# Patient Record
Sex: Male | Born: 1985 | Hispanic: No | Marital: Married | Smoking: Former smoker
Health system: Southern US, Community
[De-identification: ages and names within clinical notes are randomized; demographics above are authoritative.]

---
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
## 2005-09-12 ENCOUNTER — Emergency Department: Payer: Self-pay | Admitting: Emergency Medicine

## 2006-09-18 ENCOUNTER — Emergency Department: Payer: Self-pay | Admitting: Unknown Physician Specialty

## 2013-10-27 DIAGNOSIS — Z8679 Personal history of other diseases of the circulatory system: Secondary | ICD-10-CM | POA: Insufficient documentation

## 2013-10-27 DIAGNOSIS — G43909 Migraine, unspecified, not intractable, without status migrainosus: Secondary | ICD-10-CM | POA: Insufficient documentation

## 2014-03-25 ENCOUNTER — Emergency Department: Payer: Self-pay | Admitting: Emergency Medicine

## 2015-01-14 IMAGING — CT CT HEAD WITHOUT CONTRAST
1 series · 16 of 30 positions shown, 20 images · non-contrast
Comparison: None

CLINICAL DATA: Recent nose bleeds and frontal headache. Blurred
vision.

EXAM:
CT HEAD WITHOUT CONTRAST
TECHNIQUE: Contiguous axial images were obtained from the base of the skull
through the vertex without contrast.

[Series 2: head wo · axial · 0.40mm/px · z∈[-113,+22]mm · 16 of 34 slices shown, 20 images]
[im 2/34  brain]
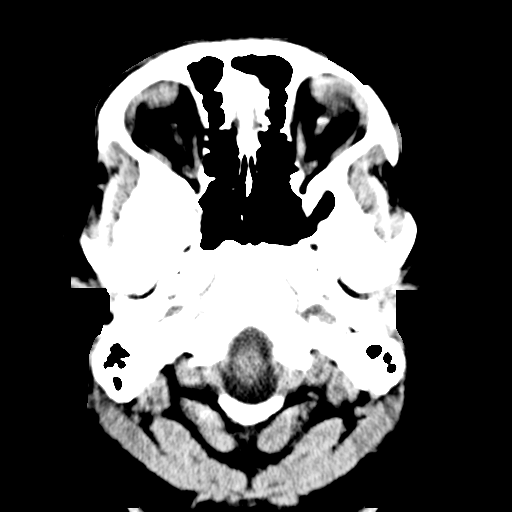
[im 2/34  bone]
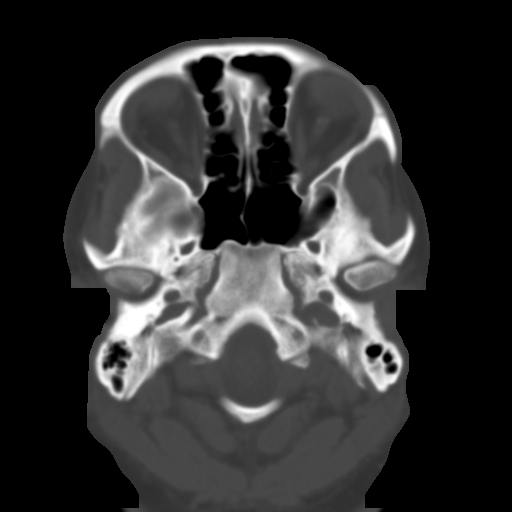
[im 4/34  brain]
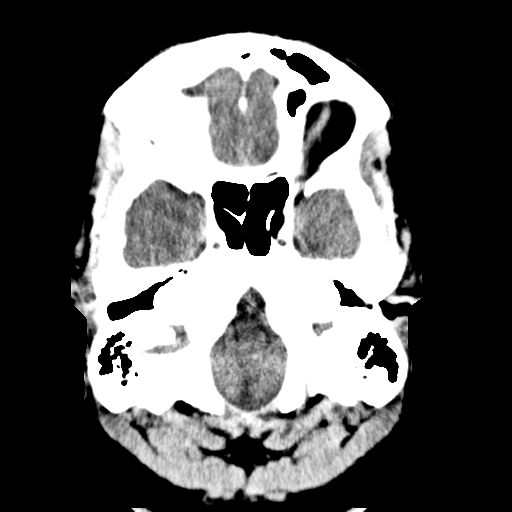
[im 6/34  brain]
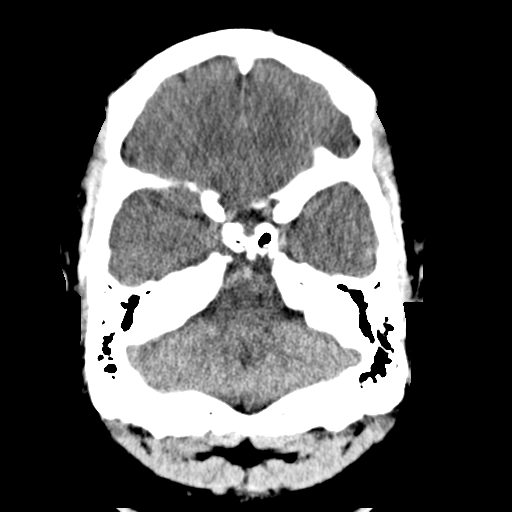
[im 8/34  brain]
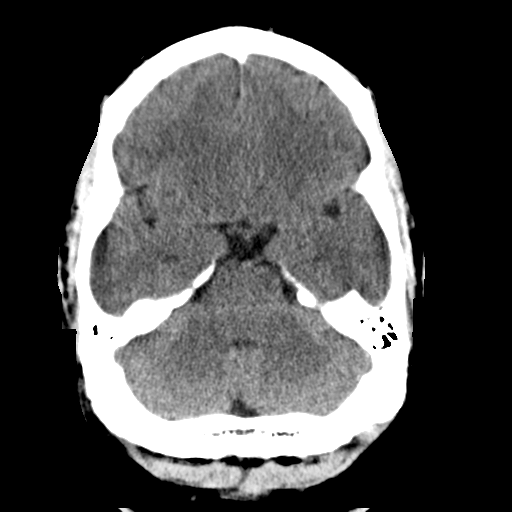
[im 10/34  brain]
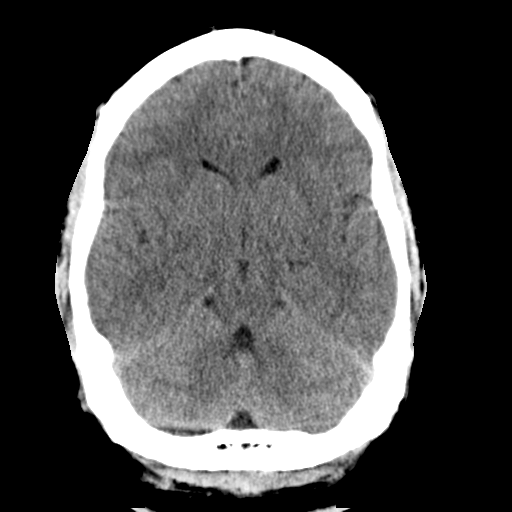
[im 10/34  bone]
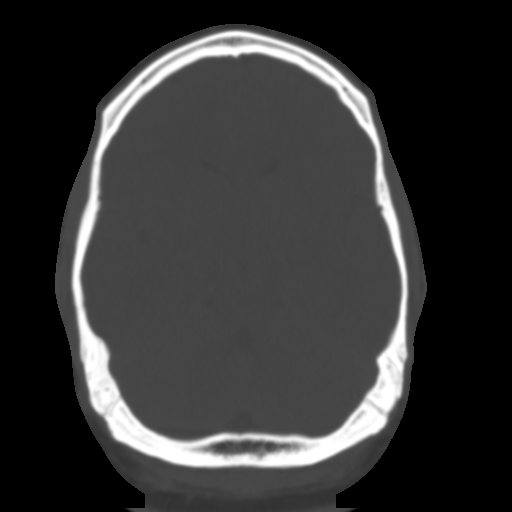
[im 12/34  brain]
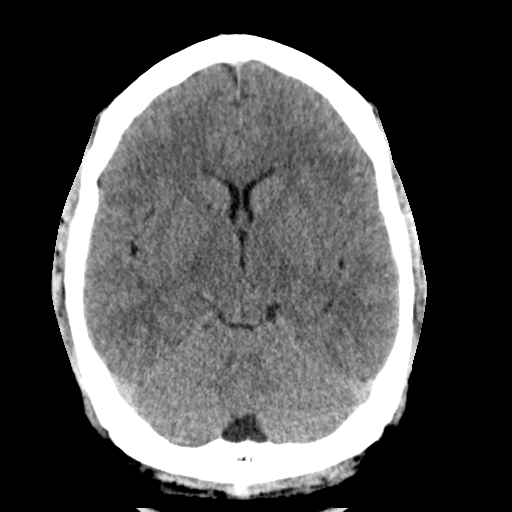
[im 14/34  brain]
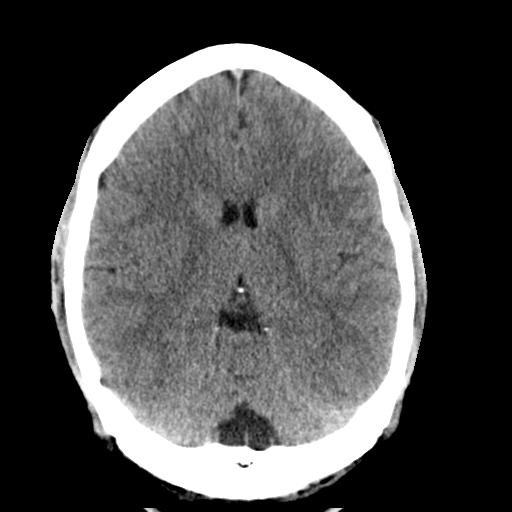
[im 16/34  brain]
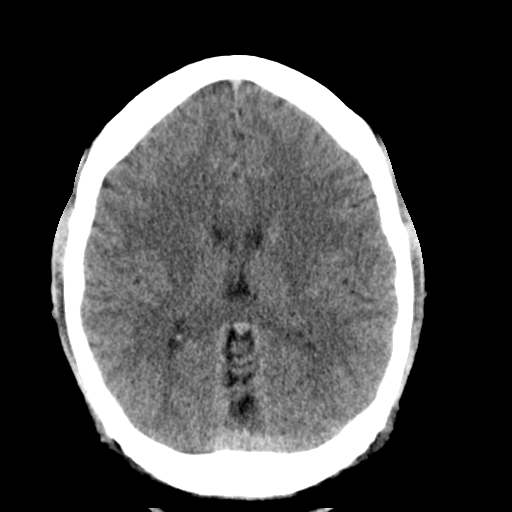
[im 18/34  brain]
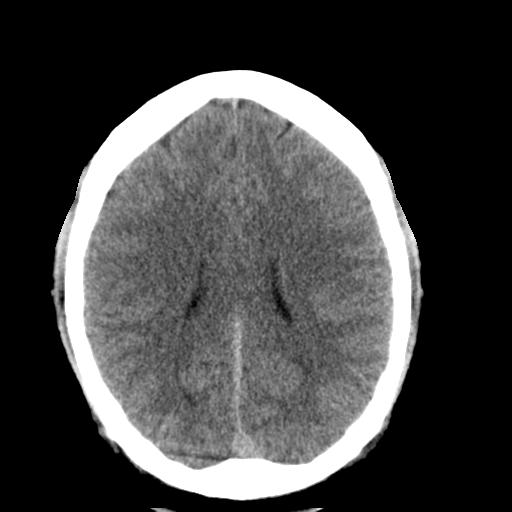
[im 18/34  bone]
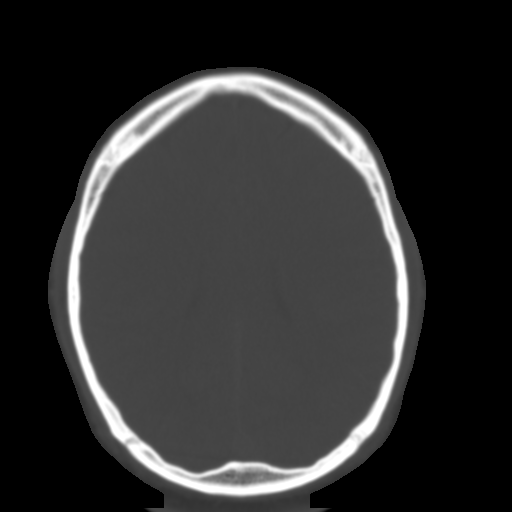
[im 20/34  brain]
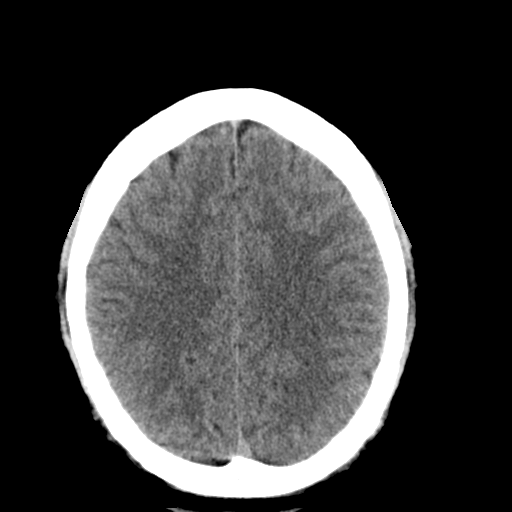
[im 22/34  brain]
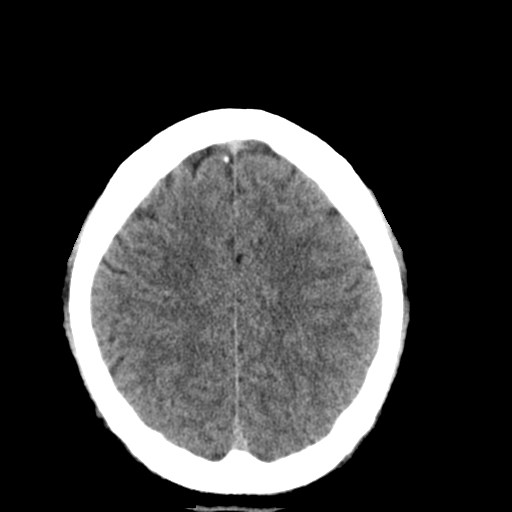
[im 24/34  brain]
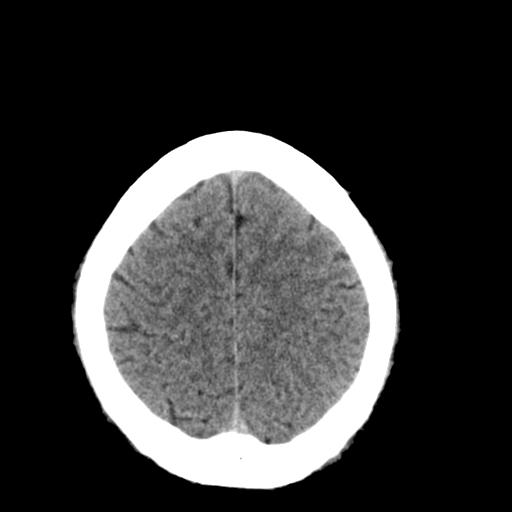
[im 26/34  brain]
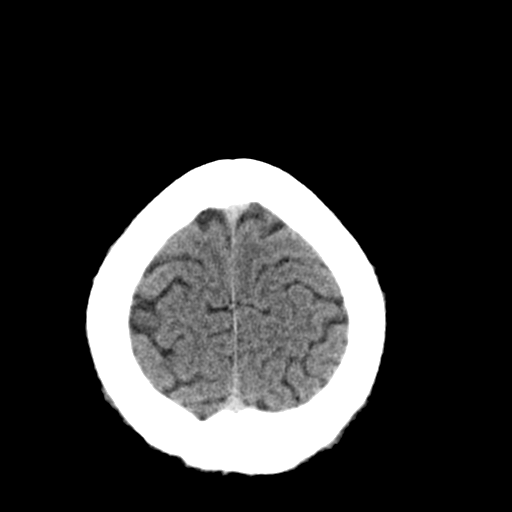
[im 26/34  bone]
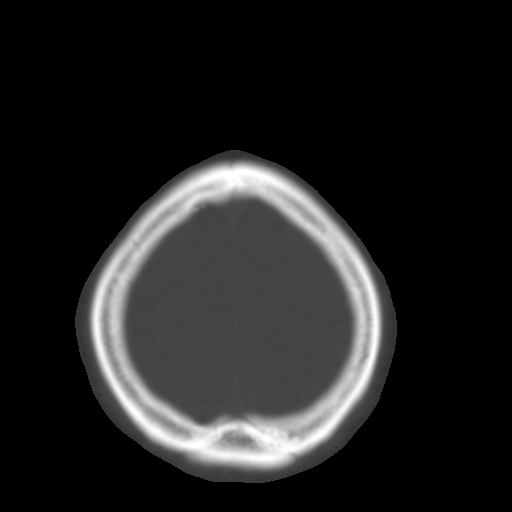
[im 28/34  brain]
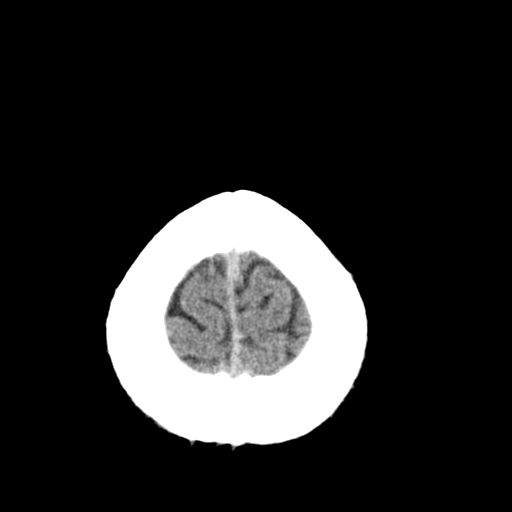
[im 30/34  brain]
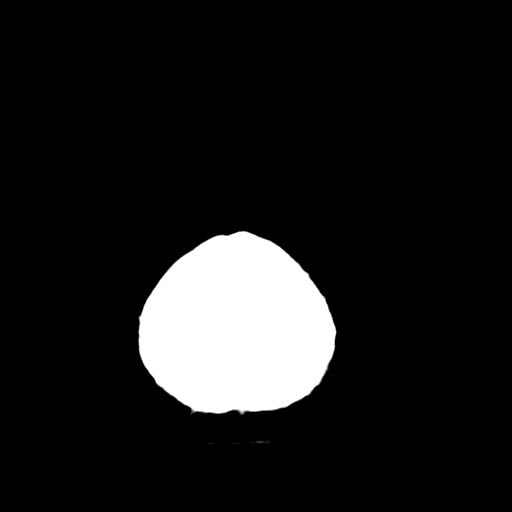
[im 32/34  brain]
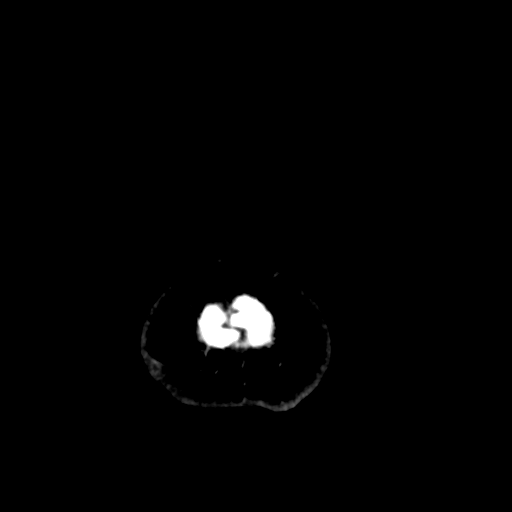

[16 of 30 positions shown; findings below may reference images not displayed]

FINDINGS: Normal appearance of the intracranial structures. No evidence for
acute hemorrhage, mass lesion, midline shift, hydrocephalus or large
infarct. No acute bony abnormality. The visualized sinuses are
clear.
IMPRESSION: No acute intracranial abnormality.

## 2018-07-29 ENCOUNTER — Emergency Department: Payer: BLUE CROSS/BLUE SHIELD

## 2018-07-29 ENCOUNTER — Other Ambulatory Visit: Payer: Self-pay

## 2018-07-29 ENCOUNTER — Emergency Department
Admission: EM | Admit: 2018-07-29 | Discharge: 2018-07-29 | Disposition: A | Discharge status: Home / Self Care | Payer: BLUE CROSS/BLUE SHIELD | Attending: Emergency Medicine | Admitting: Emergency Medicine

## 2018-07-29 ENCOUNTER — Encounter: Payer: Self-pay | Admitting: Emergency Medicine

## 2018-07-29 DIAGNOSIS — R3915 Urgency of urination: Secondary | ICD-10-CM | POA: Diagnosis not present

## 2018-07-29 DIAGNOSIS — M545 Low back pain, unspecified: Secondary | ICD-10-CM

## 2018-07-29 DIAGNOSIS — R109 Unspecified abdominal pain: Secondary | ICD-10-CM | POA: Diagnosis not present

## 2018-07-29 LAB — URINALYSIS, COMPLETE (UACMP) WITH MICROSCOPIC
BILIRUBIN URINE: NEGATIVE
Bacteria, UA: NONE SEEN
Glucose, UA: NEGATIVE mg/dL
Ketones, ur: NEGATIVE mg/dL
Nitrite: NEGATIVE
PH: 6 (ref 5.0–8.0)
PROTEIN: NEGATIVE mg/dL
Specific Gravity, Urine: 1.026 (ref 1.005–1.030)

## 2018-07-29 MED ORDER — MELOXICAM 15 MG PO TABS: 15.0000 mg | ORAL_TABLET | Freq: Every day | ORAL | 0 refills | Status: DC

## 2018-07-29 MED ORDER — METHOCARBAMOL 500 MG PO TABS: 500.0000 mg | ORAL_TABLET | Freq: Four times a day (QID) | ORAL | 0 refills | Status: DC

## 2018-07-29 NOTE — ED Triage Notes (Signed)
Patient reports bilateral lower back pain x1 weeks. States pain comes and goes and is not made worse by movement. Patient reports increased urgency with urination without increased output.

## 2018-07-29 NOTE — ED Notes (Signed)
Pt in with co left mid back pain that radiates to left lower back x 1 week. Denies any hx of back problems or injury. It is worse with inspiration and does have some urinary urgency. Pt states pain started on the right but now has moved to left back area.

## 2018-07-29 NOTE — ED Provider Notes (Signed)
[facility] Emergency Department Provider Note  ____________________________________________  Time seen: Approximately 8:29 PM  I have reviewed the triage vital signs and the nursing notes.   HISTORY  Chief Complaint Back Pain    HPI Jared Dixon is a 32 y.o. male who presents the emergency department complaining of flank pain, urinary urgency and frequency.  Patient reports that he has had right-sided back pain x1 week.  It was intermittent in nature, not affected by position or palpation.  Patient reports that symptoms have eased somewhat and he is also developing left-sided back pain.  Patient reports urinary frequency and urgency but denies any dysuria.  He denies any visible hematuria.  No penile drainage.  Patient reports that he has had chlamydia in the past and has no symptoms concerning for STD at this time.  Has had some issues with constipation/gas and is unsure whether symptoms may be from this as well.  He denies any diarrhea, constipation, hematemesis, hematochezia.  No medications for this complaint prior to arrival.  No other complaints at this time.    History reviewed. No pertinent past medical history.  There are no active problems to display for this patient.   History reviewed. No pertinent surgical history.  Prior to Admission medications   Medication Sig Start Date End Date Taking? Authorizing Provider  meloxicam (MOBIC) 15 MG tablet Take 1 tablet (15 mg total) by mouth daily. 07/29/18   Aralynn Brake, Delorise Royals, PA-C  methocarbamol (ROBAXIN) 500 MG tablet Take 1 tablet (500 mg total) by mouth 4 (four) times daily. 07/29/18   Aadi Bordner, Delorise Royals, PA-C    Allergies Patient has no known allergies.  No family history on file.  Social History Social History   Tobacco Use  . Smoking status: Never Smoker  . Smokeless tobacco: Never Used  Substance Use Topics  . Alcohol use: Yes  . Drug use: Never     Review of Systems   Constitutional: No fever/chills Eyes: No visual changes. Cardiovascular: no chest pain. Respiratory: no cough. No SOB. Gastrointestinal: No abdominal pain.  No nausea, no vomiting.  No diarrhea.  No constipation. Genitourinary: Negative for dysuria. No hematuria.  Positive for urinary urgency and frequency. Musculoskeletal: Right flank/lower back pain, no left-sided back pain. Skin: Negative for rash, abrasions, lacerations, ecchymosis. Neurological: Negative for headaches, focal weakness or numbness. 10-point ROS otherwise negative.  ____________________________________________   PHYSICAL EXAM:  VITAL SIGNS: ED Triage Vitals  Enc Vitals Group     BP 07/29/18 1854 (!) 161/119     Pulse Rate 07/29/18 1854 90     Resp 07/29/18 1854 18     Temp 07/29/18 1854 98.3 F (36.8 C)     Temp Source 07/29/18 1854 Oral     SpO2 07/29/18 1854 98 %     Weight 07/29/18 1855 230 lb (104.3 kg)     Height 07/29/18 1855 5\' 9"  (1.753 m)     Head Circumference --      Peak Flow --      Pain Score 07/29/18 1854 10     Pain Loc --      Pain Edu? --      Excl. in GC? --      Constitutional: Alert and oriented. Well appearing and in no acute distress. Eyes: Conjunctivae are normal. PERRL. EOMI. Head: Atraumatic. ENT:      Ears:       Nose: No congestion/rhinnorhea.      Mouth/Throat: Mucous membranes are moist.  Neck: No stridor.  No cervical spine tenderness to palpation. Hematological/Lymphatic/Immunilogical: No cervical lymphadenopathy. Cardiovascular: Normal rate, regular rhythm. Normal S1 and S2.  Good peripheral circulation. Respiratory: Normal respiratory effort without tachypnea or retractions. Lungs CTAB. Good air entry to the bases with no decreased or absent breath sounds. Gastrointestinal: Bowel sounds 4 quadrants. Soft and nontender to palpation. No guarding or rigidity. No palpable masses. No distention. No CVA tenderness. Musculoskeletal: Full range of motion to all  extremities. No gross deformities appreciated.  Visualization of the lumbar spine reveals no deformity or or abnormality.  Full range of motion to lumbar spine.  No tenderness to palpation midline or by lateral paraspinal muscle region.  No palpable abnormality or step-off.  No tenderness to palpation over bilateral sciatic notches.  Negative straight leg raise bilaterally.  Dorsalis pedis pulse and sensation intact and equal bilateral lower extremities.   Neurologic:  Normal speech and language. No gross focal neurologic deficits are appreciated.  Skin:  Skin is warm, dry and intact. No rash noted. Psychiatric: Mood and affect are normal. Speech and behavior are normal. Patient exhibits appropriate insight and judgement.   ____________________________________________   LABS (all labs ordered are listed, but only abnormal results are displayed)  Labs Reviewed  URINALYSIS, COMPLETE (UACMP) WITH MICROSCOPIC - Abnormal; Notable for the following components:      Result Value   Color, Urine YELLOW (*)    APPearance CLEAR (*)    Hgb urine dipstick SMALL (*)    Leukocytes, UA SMALL (*)    All other components within normal limits   ____________________________________________  EKG   ____________________________________________  RADIOLOGY I personally viewed and evaluated these images as part of my medical decision making, as well as reviewing the written report by the radiologist.  Ct Renal Stone Study  Result Date: 07/29/2018 CLINICAL DATA:  Acute onset of left flank pain. EXAM: CT ABDOMEN AND PELVIS WITHOUT CONTRAST TECHNIQUE: Multidetector CT imaging of the abdomen and pelvis was performed following the standard protocol without IV contrast. COMPARISON:  None. FINDINGS: Lower chest: The visualized lung bases are grossly clear. The visualized portions of the mediastinum are unremarkable. Hepatobiliary: The liver is unremarkable in appearance. The gallbladder is unremarkable in appearance.  The common bile duct remains normal in caliber. Pancreas: The pancreas is within normal limits. Spleen: The spleen is unremarkable in appearance. Adrenals/Urinary Tract: The adrenal glands are unremarkable in appearance. The kidneys are within normal limits. There is no evidence of hydronephrosis. No renal or ureteral stones are identified. No perinephric stranding is seen. Stomach/Bowel: The stomach is unremarkable in appearance. The small bowel is within normal limits. The appendix is normal in caliber, without evidence of appendicitis. The colon is unremarkable in appearance. Vascular/Lymphatic: The abdominal aorta is unremarkable in appearance. The inferior vena cava is grossly unremarkable. No retroperitoneal lymphadenopathy is seen. No pelvic sidewall lymphadenopathy is identified. Reproductive: The bladder is mildly distended and grossly unremarkable. The prostate remains normal in size. Other: Small anterior abdominal wall hernias are noted, superior to and at the umbilicus, without evidence of bowel herniation. Musculoskeletal: No acute osseous abnormalities are identified. The visualized musculature is unremarkable in appearance. IMPRESSION: 1. No acute abnormality seen within the abdomen or pelvis. 2. Small anterior abdominal wall hernias, superior to and at the umbilicus, without evidence of bowel herniation. Electronically Signed   By: Roanna Raider M.D.   On: 07/29/2018 21:08    ____________________________________________    PROCEDURES  Procedure(s) performed:    Procedures  Medications - No data to display   ____________________________________________   INITIAL IMPRESSION / ASSESSMENT AND PLAN / ED COURSE  Pertinent labs & imaging results that were available during my care of the patient were reviewed by me and considered in my medical decision making (see chart for details).  Review of the [place] CSRS was performed in accordance of the [facility] prior to dispensing any  controlled drugs.      Patient's diagnosis is consistent with low back pain, urinary urgency.  Patient presents the emergency department with multiple complaints.  Patient complains of lower back pain, possible constipation, urinary urgency.  On exam, exam was reassuring.  Patient had minimal leukocytes and hemoglobin and urine.  No symptoms consistent with UTI.  Given patient's back pain, small amount of hemoglobin, and other complaints, patient was given CT scan.  This returns with incidental finding of abdominal hernias, however no explanation for patient's symptoms.  At this time, patient likely has cystitis, muscle strain.  Patient will be treated with muscle relaxer and anti-inflammatory.  Patient is given instructions to return for follow-up with primary care if urinary complaints remain or worsen.  No indication at this time for follow-up with surgery for abdominal wall hernias.. Patient will be discharged home with prescriptions for meloxicam and Robaxin. Patient is to follow up with primary care as needed or otherwise directed. Patient is given ED precautions to return to the ED for any worsening or new symptoms.     ____________________________________________  FINAL CLINICAL IMPRESSION(S) / ED DIAGNOSES  Final diagnoses:  Acute left-sided low back pain without sciatica  Urinary urgency      NEW MEDICATIONS STARTED DURING THIS VISIT:  ED Discharge Orders        Ordered    meloxicam (MOBIC) 15 MG tablet  Daily     07/29/18 2133    methocarbamol (ROBAXIN) 500 MG tablet  4 times daily     07/29/18 2133          This chart was dictated using voice recognition software/Dragon. Despite best efforts to proofread, errors can occur which can change the meaning. Any change was purely unintentional.    Racheal PatchesCuthriell, Areta Terwilliger D, PA-C 07/29/18 2143    Loleta RoseForbach, Cory, MD 07/29/18 [phone]

## 2019-04-01 ENCOUNTER — Encounter: Payer: Self-pay | Admitting: Family Medicine

## 2019-04-02 ENCOUNTER — Encounter: Payer: Self-pay | Admitting: Family Medicine

## 2019-04-02 ENCOUNTER — Other Ambulatory Visit: Payer: Self-pay

## 2019-04-02 ENCOUNTER — Ambulatory Visit (INDEPENDENT_AMBULATORY_CARE_PROVIDER_SITE_OTHER): Payer: BLUE CROSS/BLUE SHIELD | Admitting: Family Medicine

## 2019-04-02 VITALS — BP 136/86 | HR 88 | Temp 97.9°F | Resp 16 | Ht 68.75 in | Wt 235.8 lb

## 2019-04-02 DIAGNOSIS — M7542 Impingement syndrome of left shoulder: Secondary | ICD-10-CM

## 2019-04-02 DIAGNOSIS — Z791 Long term (current) use of non-steroidal anti-inflammatories (NSAID): Secondary | ICD-10-CM | POA: Diagnosis not present

## 2019-04-02 DIAGNOSIS — E669 Obesity, unspecified: Secondary | ICD-10-CM

## 2019-04-02 DIAGNOSIS — Z113 Encounter for screening for infections with a predominantly sexual mode of transmission: Secondary | ICD-10-CM | POA: Diagnosis not present

## 2019-04-02 DIAGNOSIS — Z130 Encounter for screening for diseases of the blood and blood-forming organs and certain disorders involving the immune mechanism: Secondary | ICD-10-CM

## 2019-04-02 DIAGNOSIS — M25812 Other specified joint disorders, left shoulder: Secondary | ICD-10-CM

## 2019-04-02 DIAGNOSIS — Z131 Encounter for screening for diabetes mellitus: Secondary | ICD-10-CM | POA: Diagnosis not present

## 2019-04-02 DIAGNOSIS — Z23 Encounter for immunization: Secondary | ICD-10-CM | POA: Diagnosis not present

## 2019-04-02 DIAGNOSIS — Z13220 Encounter for screening for lipoid disorders: Secondary | ICD-10-CM

## 2019-04-02 DIAGNOSIS — E66811 Obesity, class 1: Secondary | ICD-10-CM

## 2019-04-02 DIAGNOSIS — M67432 Ganglion, left wrist: Secondary | ICD-10-CM

## 2019-04-02 MED ORDER — GABAPENTIN 100 MG PO CAPS: 100.0000 mg | ORAL_CAPSULE | Freq: Every day | ORAL | 0 refills | Status: DC

## 2019-04-02 NOTE — Patient Instructions (Signed)
Shoulder Impingement Syndrome Rehab  Ask your health care provider which exercises are safe for you. Do exercises exactly as told by your health care provider and adjust them as directed. It is normal to feel mild stretching, pulling, tightness, or discomfort as you do these exercises, but you should stop right away if you feel sudden pain or your pain gets worse. Do not begin these exercises until told by your health care provider.  Stretching and range of motion exercise  This exercise warms up your muscles and joints and improves the movement and flexibility of your shoulder. This exercise also helps to relieve pain and stiffness.  Exercise A: Passive horizontal adduction    1. Sit or stand and pull your left / right elbow across your chest, toward your other shoulder. Stop when you feel a gentle stretch in the back of your shoulder and upper arm.  ? Keep your arm at shoulder height.  ? Keep your arm as close to your body as you comfortably can.  2. Hold for __________ seconds.  3. Slowly return to the starting position.  Repeat __________ times. Complete this exercise __________ times a day.  Strengthening exercises  These exercises build strength and endurance in your shoulder. Endurance is the ability to use your muscles for a long time, even after they get tired.  Exercise B: External rotation, isometric  1. Stand or sit in a doorway, facing the door frame.  2. Bend your left / right elbow and place the back of your wrist against the door frame. Only your wrist should be touching the frame. Keep your upper arm at your side.  3. Gently press your wrist against the door frame, as if you are trying to push your arm away from your abdomen.  ? Avoid shrugging your shoulder while you press your hand against the door frame. Keep your shoulder blade tucked down toward the middle of your back.  4. Hold for __________ seconds.  5. Slowly release the tension, and relax your muscles completely before you do the exercise  again.  Repeat __________ times. Complete this exercise __________ times a day.  Exercise C: Internal rotation, isometric    1. Stand or sit in a doorway, facing the door frame.  2. Bend your left / right elbow and place the inside of your wrist against the door frame. Only your wrist should be touching the frame. Keep your upper arm at your side.  3. Gently press your wrist against the door frame, as if you are trying to push your arm toward your abdomen.  ? Avoid shrugging your shoulder while you press your hand against the door frame. Keep your shoulder blade tucked down toward the middle of your back.  4. Hold for __________ seconds.  5. Slowly release the tension, and relax your muscles completely before you do the exercise again.  Repeat __________ times. Complete this exercise __________ times a day.  Exercise D: Scapular protraction, supine    1. Lie on your back on a firm surface. Hold a __________ weight in your left / right hand.  2. Raise your left / right arm straight into the air so your hand is directly above your shoulder joint.  3. Push the weight into the air so your shoulder lifts off of the surface that you are lying on. Do not move your head, neck, or back.  4. Hold for __________ seconds.  5. Slowly return to the starting position. Let your muscles relax completely before   you repeat this exercise.  Repeat __________ times. Complete this exercise __________ times a day.  Exercise E: Scapular retraction    1. Sit in a stable chair without armrests, or stand.  2. Secure an exercise band to a stable object in front of you so the band is at shoulder height.  3. Hold one end of the exercise band in each hand. Your palms should face down.  4. Squeeze your shoulder blades together and move your elbows slightly behind you. Do not shrug your shoulders while you do this.  5. Hold for __________ seconds.  6. Slowly return to the starting position.  Repeat __________ times. Complete this exercise __________  times a day.  Exercise F: Shoulder extension    1. Sit in a stable chair without armrests, or stand.  2. Secure an exercise band to a stable object in front of you where the band is above shoulder height.  3. Hold one end of the exercise band in each hand.  4. Straighten your elbows and lift your hands up to shoulder height.  5. Squeeze your shoulder blades together and pull your hands down to the sides of your thighs. Stop when your hands are straight down by your sides. Do not let your hands go behind your body.  6. Hold for __________ seconds.  7. Slowly return to the starting position.  Repeat __________ times. Complete this exercise __________ times a day.  This information is not intended to replace advice given to you by your health care provider. Make sure you discuss any questions you have with your health care provider.  Document Released: 12/11/2005 Document Revised: 08/17/2016 Document Reviewed: 11/13/2015  Elsevier Interactive Patient Education © 2019 [employer]

## 2019-04-02 NOTE — Progress Notes (Signed)
Name: Jared Dixon   MRN: [id number]    DOB: 1986-04-05   Date:04/02/2019       Progress Note  Subjective  Chief Complaint  Chief Complaint  Patient presents with  . Establish Care  . Shoulder Pain    Onset-1 month, left shoulder pain- has been taking Aleve, heating pad and muscle cream. 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 He is left hand dominant. He has been taking aleve , motrin daily for the shoulder and pain is still daily and is can be burning, aching, at night he has difficulty sleeping because of pain. He states pain right now is 7-8 and worse at night. No rashes.    Patient Active Problem List   Diagnosis Date Noted  . Obesity (BMI 30.0-34.9) 04/02/2019  . History of hypertension 10/27/2013  . Migraine 10/27/2013    History reviewed. No pertinent surgical history.  Family History  Problem Relation Age of Onset  . Hypertension Father   . Cancer Maternal Grandmother        States it is in remission  . Liver disease Maternal Grandfather   . Alcohol abuse Maternal Grandfather   . Hypertension Paternal Grandmother     Social History   Socioeconomic History  . Marital status: Married    Spouse name: Driante  . Number of children: 2  . Years of education: Not on file  . Highest education level: Some college, no degree  Occupational History  . Not on file  Social Needs  . Financial resource strain: Not hard at all  . Food insecurity:    Worry: Never true    Inability: Never true  . Transportation needs:    Medical: No    Non-medical: No  Tobacco Use  . Smoking status: Former Smoker     Packs/day: 0.50    Types: Cigarettes    Start date: 12/26/2003    Last attempt to quit: 04/01/2009    Years since quitting: 10.0  . Smokeless tobacco: Never Used  Substance and Sexual Activity  . Alcohol use: Yes    Comment: occasionally  . Drug use: Never  . Sexual activity: Yes    Partners: Female  Lifestyle  . Physical activity:    Days per week: 3 days    Minutes per session: 60 min  . Stress: Only a little  Relationships  . Social connections:    Talks on phone: More than three times a week    Gets together: Twice a week    Attends religious service: Never    Active member of club or organization: No    Attends meetings of clubs or organizations: Never    Relationship status: Married  . Intimate partner violence:    Fear of current or ex partner: No    Emotionally abused: No    Physically abused: No    Forced sexual activity: No  Other Topics Concern  . Not on file  Social History Narrative  . Not on file     Current Outpatient Medications:  .  gabapentin (NEURONTIN) 100 MG capsule, Take 1-3 capsules (100-300 mg total) by mouth at bedtime. Start at 1 and go up by 1 every 3 nights to a max of 3 at night, Disp: 90 capsule, Rfl: 0  Allergies  Allergen Reactions  . Lisinopril     Hair loss     I personally reviewed active problem list, medication list, allergies, family history with the patient/caregiver today.   ROS  Constitutional: Negative for fever or weight change.  Respiratory: Negative for cough and shortness of breath.   Cardiovascular: Negative for chest pain or palpitations.  Gastrointestinal: Negative for abdominal pain, no bowel changes.  Musculoskeletal: Negative for gait problem or joint swelling.  Skin: Negative for rash.  Neurological: Negative for dizziness or headache.  No other specific complaints in a complete review of systems (except as listed in HPI above).  Objective  Vitals:   04/02/19 1117  BP: 136/86  Pulse: 88  Resp: 16   Temp: 97.9 F (36.6 C)  TempSrc: Oral  SpO2: 98%  Weight: 235 lb 12.8 oz (107 kg)  Height: 5' 8.75" (1.746 m)    Body mass index is 35.08 kg/m.  Physical Exam  Constitutional: Patient appears well-developed and well-nourished. Obese but also muscular No distress.  HEENT: head atraumatic, normocephalic, pupils equal and reactive to light, neck supple,wearing a mask Cardiovascular: Normal rate, regular rhythm and normal heart sounds.  No murmur heard. No BLE edema. Pulmonary/Chest: Effort normal and breath sounds normal. No respiratory distress. Abdominal: Soft.  There is no tenderness. Muscular Skeletal: pain during palpation of left shoulder anteriorly. Pain with abductions, reaching across and also positive empty can sign, mild pain with internal rotation , ganglion cyst on left dorsal radial side  Psychiatric: Patient has a normal mood and affect. behavior is normal. Judgment and thought content normal.  PHQ2/9: Depression screen [facility] 2/9 04/02/2019  Decreased Interest 0  Down, Depressed, Hopeless 0  PHQ - 2 Score 0  Altered sleeping 0  Tired, decreased energy 0  Change in appetite 0  Feeling bad or failure about yourself  0  Trouble concentrating 0  Moving slowly or fidgety/restless 0  Suicidal thoughts 0  PHQ-9 Score 0  Difficult doing work/chores Not difficult at all    phq 9 is negative   Fall Risk: Fall Risk  04/02/2019  Falls in the past year? 0  Number falls in past yr: 0  Injury with Fall? 0     Functional Status Survey: Is the patient deaf or have difficulty hearing?: No Does the patient have difficulty seeing, even when wearing glasses/contacts?: No Does the patient have difficulty concentrating, remembering, or making decisions?: No Does the patient have difficulty walking or climbing stairs?: No Does the patient have difficulty dressing or bathing?: No Does the patient have difficulty doing errands alone such as visiting a doctor's office or shopping?:  No    Assessment & Plan  1. Shoulder impingement, left  He will try home exercises first since , and if symptoms do no improve refer to Ortho   He would like to help with the pain at night, we will try gabapentin 300 mg at night  2. Obesity (BMI 30.0-34.9)  - COMPLETE METABOLIC PANEL WITH GFR  3. Need for Tdap vaccination  - Tdap vaccine greater than or equal to 7yo IM  4. Lipid screening  - Lipid panel  5. Diabetes mellitus screening  - Hemoglobin A1c  6. Routine screening for STI (sexually transmitted infection)  -  HIV Antibody (routine testing w rflx) - RPR  7. Screening for deficiency anemia  - CBC with Differential/Platelet  8. Long term (current) use of non-steroidal anti-inflammatories (nsaid)  - COMPLETE METABOLIC PANEL WITH GFR Discussed importance of stop taking nsaid's all the time   9. Ganglion cyst of dorsum of left wrist  Discussed benign condition, can see ortho if needed

## 2019-04-03 LAB — LIPID PANEL
Cholesterol: 198 mg/dL (ref <200)
HDL: 38 mg/dL — ABNORMAL LOW (ref >=40)
Non-HDL Cholesterol (Calc): 160 mg/dL (calc) — ABNORMAL HIGH (ref <130)
Total CHOL/HDL Ratio: 5.2 (calc) — ABNORMAL HIGH (ref <5.0)
Triglycerides: 417 mg/dL — ABNORMAL HIGH (ref <150)

## 2019-04-03 LAB — HIV ANTIBODY (ROUTINE TESTING W REFLEX): HIV 1&2 Ab, 4th Generation: NONREACTIVE

## 2019-04-03 LAB — CBC WITH DIFFERENTIAL/PLATELET
Absolute Monocytes: 592 cells/uL (ref 200–950)
Basophils Absolute: 26 cells/uL (ref 0–200)
Basophils Relative: 0.3 %
Eosinophils Absolute: 104 cells/uL (ref 15–500)
Eosinophils Relative: 1.2 %
HCT: 45.5 % (ref 38.5–50.0)
Hemoglobin: 14.9 g/dL (ref 13.2–17.1)
Lymphs Abs: 3054 cells/uL (ref 850–3900)
MCH: 26.2 pg — ABNORMAL LOW (ref 27.0–33.0)
MCHC: 32.7 g/dL (ref 32.0–36.0)
MCV: 80.1 fL (ref 80.0–100.0)
MPV: 9.9 fL (ref 7.5–12.5)
Monocytes Relative: 6.8 %
Neutro Abs: 4924 cells/uL (ref 1500–7800)
Neutrophils Relative %: 56.6 %
Platelets: 350 10*3/uL (ref 140–400)
RBC: 5.68 10*6/uL (ref 4.20–5.80)
RDW: 14.5 % (ref 11.0–15.0)
Total Lymphocyte: 35.1 %
WBC: 8.7 10*3/uL (ref 3.8–10.8)

## 2019-04-03 LAB — COMPLETE METABOLIC PANEL WITH GFR
AG Ratio: 1.4 (calc) (ref 1.0–2.5)
ALT: 22 U/L (ref 9–46)
AST: 16 U/L (ref 10–40)
Albumin: 4.7 g/dL (ref 3.6–5.1)
Alkaline phosphatase (APISO): 58 U/L (ref 36–130)
BUN: 15 mg/dL (ref 7–25)
CO2: 27 mmol/L (ref 20–32)
Calcium: 10 mg/dL (ref 8.6–10.3)
Chloride: 101 mmol/L (ref 98–110)
Creat: 0.96 mg/dL (ref 0.60–1.35)
GFR, Est African American: 120 mL/min/{1.73_m2} (ref >=60)
GFR, Est Non African American: 103 mL/min/{1.73_m2} (ref >=60)
Globulin: 3.4 g/dL (calc) (ref 1.9–3.7)
Glucose, Bld: 101 mg/dL — ABNORMAL HIGH (ref 65–99)
Potassium: 4.2 mmol/L (ref 3.5–5.3)
Sodium: 136 mmol/L (ref 135–146)
Total Bilirubin: 0.4 mg/dL (ref 0.2–1.2)
Total Protein: 8.1 g/dL (ref 6.1–8.1)

## 2019-04-03 LAB — HEMOGLOBIN A1C
Hgb A1c MFr Bld: 6.1 % of total Hgb — ABNORMAL HIGH (ref <5.7)
Mean Plasma Glucose: 128 (calc)
eAG (mmol/L): 7.1 (calc)

## 2019-04-03 LAB — RPR: RPR Ser Ql: NONREACTIVE

## 2019-05-20 IMAGING — CT CT RENAL STONE PROTOCOL
2 of 4 series · 16 of 46 positions shown, 18 images · non-contrast
Comparison: None.

CLINICAL DATA: Acute onset of left flank pain.

EXAM:
CT ABDOMEN AND PELVIS WITHOUT CONTRAST
TECHNIQUE: Multidetector CT imaging of the abdomen and pelvis was performed
following the standard protocol without IV contrast.

[Series 2: stone full standard · axial · 0.72mm/px · z∈[-521,-76]mm · 13 of 99 slices shown, 15 images]
[im 5/99  soft-tissue]
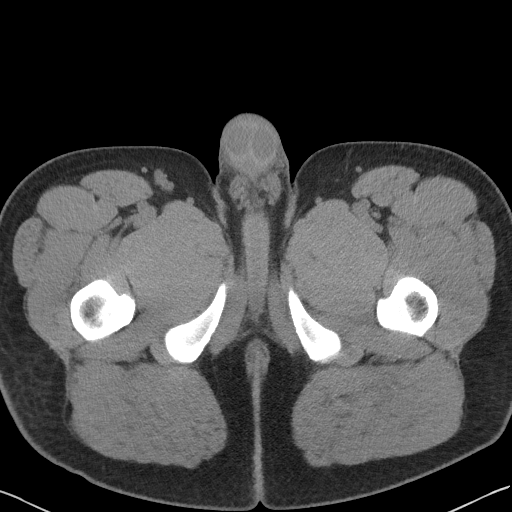
[im 5/99  bone]
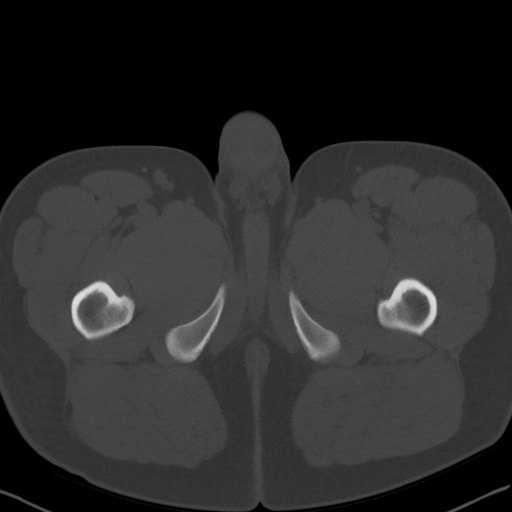
[im 13/99  soft-tissue]
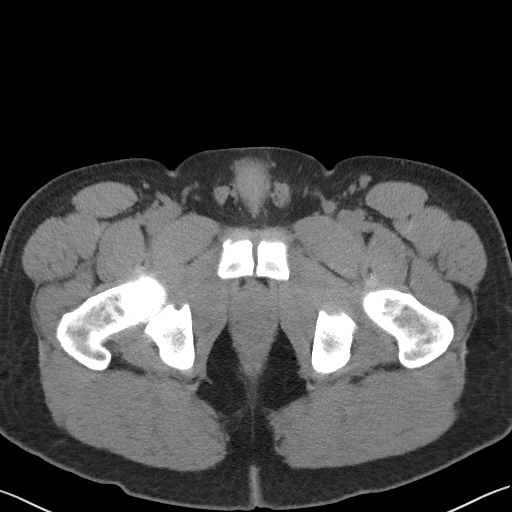
[im 22/99  soft-tissue]
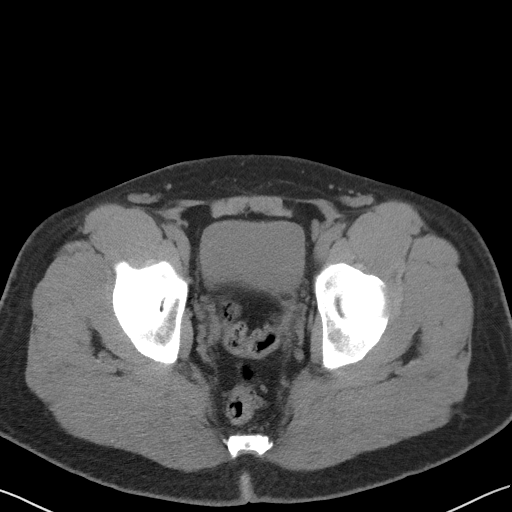
[im 26/99  soft-tissue]
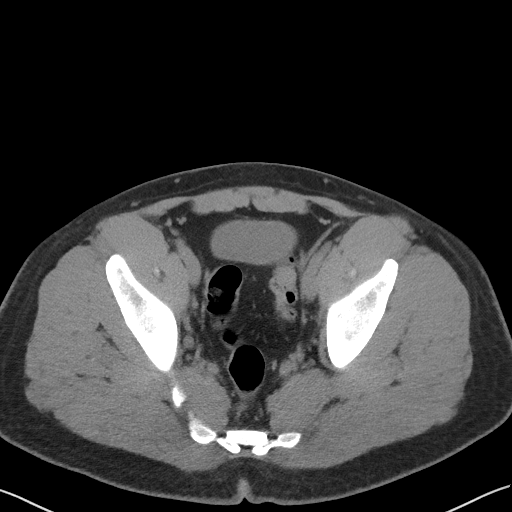
[im 35/99  soft-tissue]
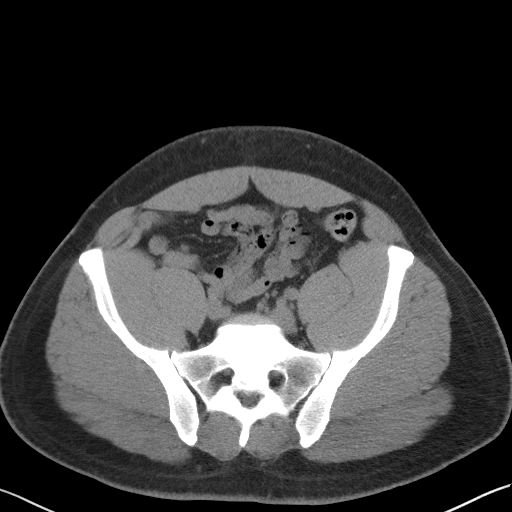
[im 43/99  soft-tissue]
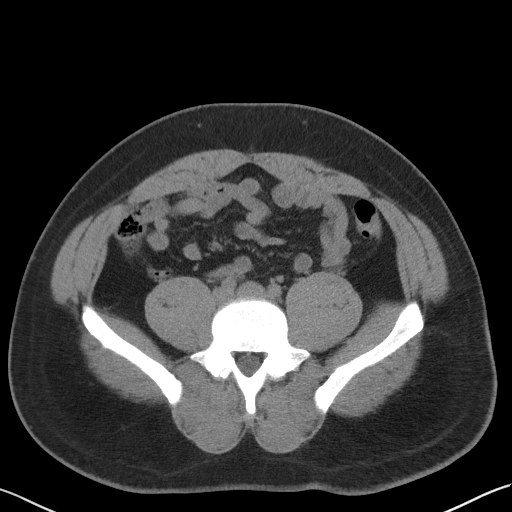
[im 52/99  soft-tissue]
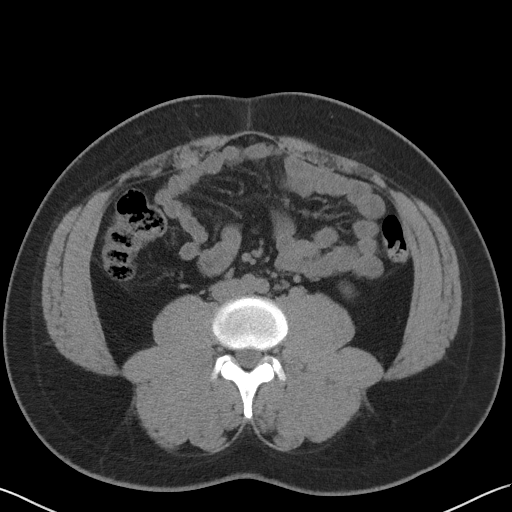
[im 56/99  soft-tissue]
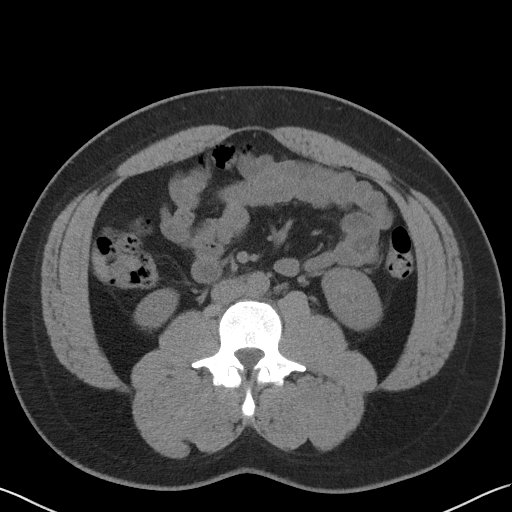
[im 64/99  soft-tissue]
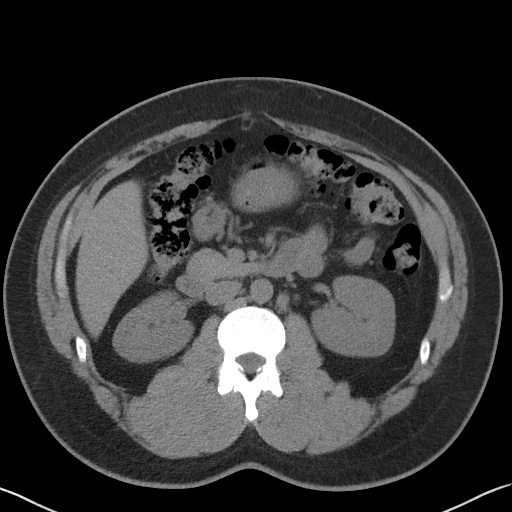
[im 64/99  bone]
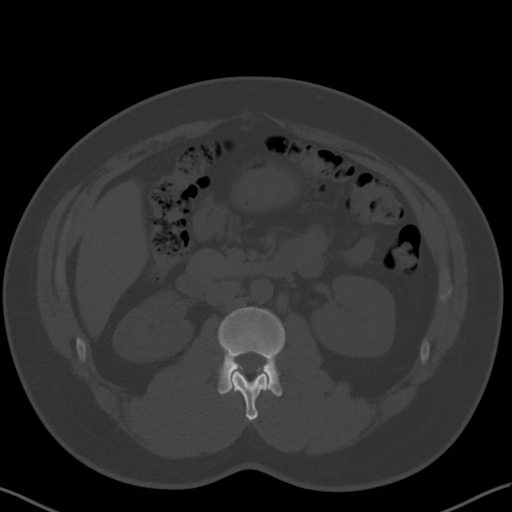
[im 73/99  soft-tissue]
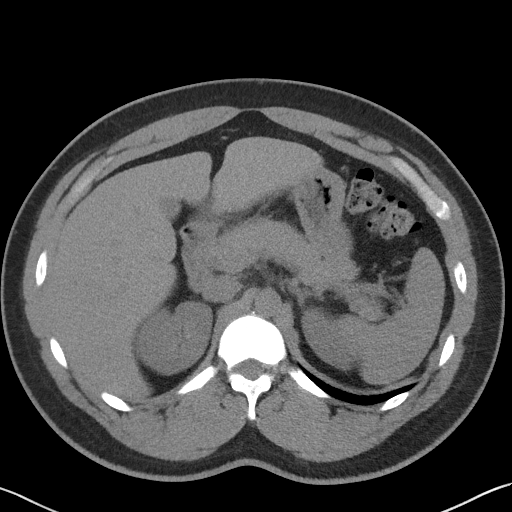
[im 77/99  soft-tissue]
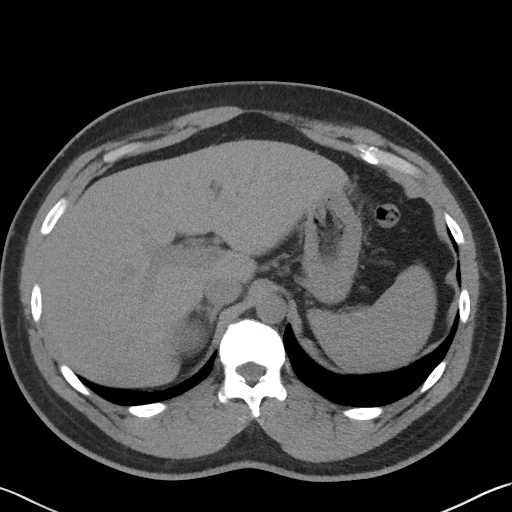
[im 86/99  soft-tissue]
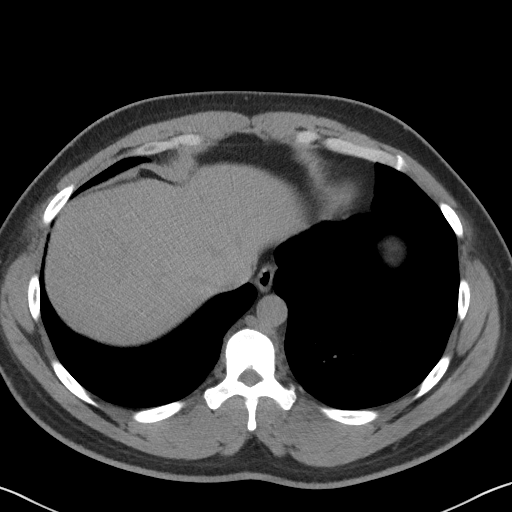
[im 94/99  soft-tissue]
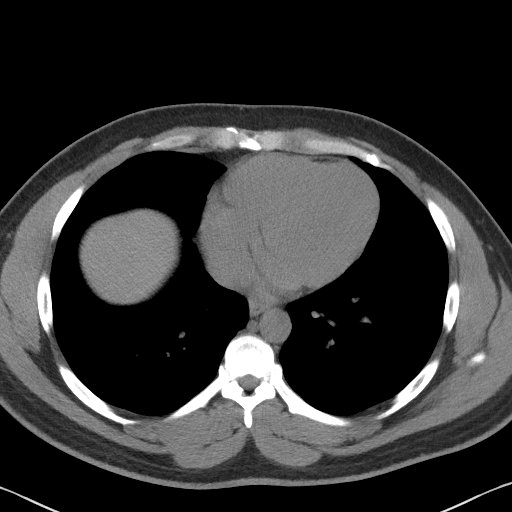

[Series 5: coronal · coronal · 0.75mm/px · 3 of 157 slices shown]
[im 53/157  soft-tissue]
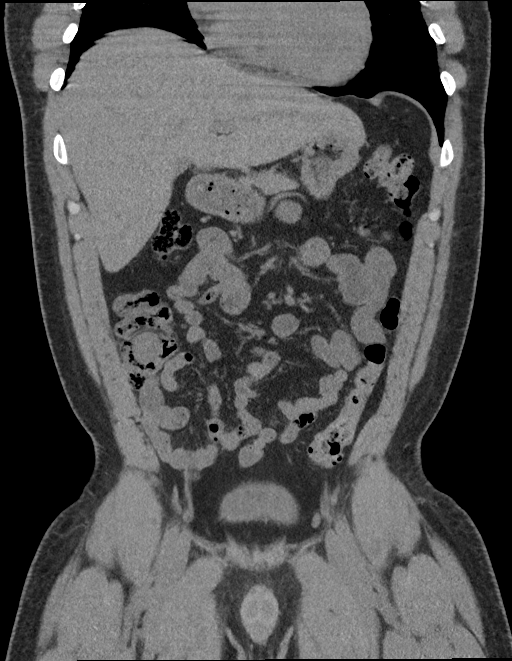
[im 70/157  soft-tissue]
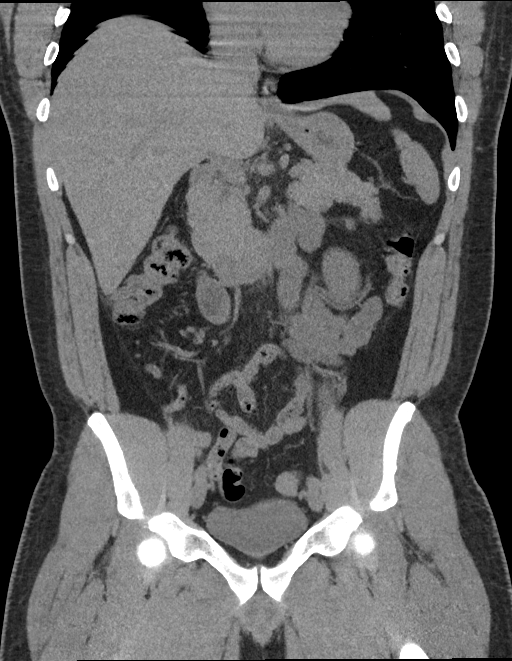
[im 87/157  soft-tissue]
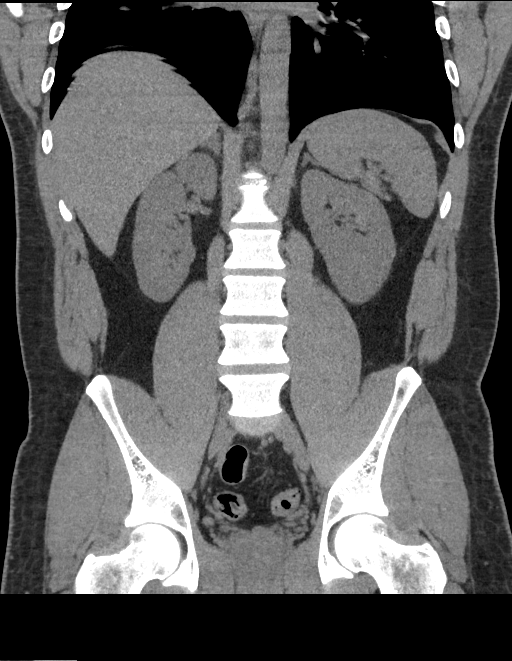

[16 of 46 positions shown; findings below may reference images not displayed]

FINDINGS: Lower chest: The visualized lung bases are grossly clear. The
visualized portions of the mediastinum are unremarkable.

Hepatobiliary: The liver is unremarkable in appearance. The
gallbladder is unremarkable in appearance. The common bile duct
remains normal in caliber.

Pancreas: The pancreas is within normal limits.

Spleen: The spleen is unremarkable in appearance.

Adrenals/Urinary Tract: The adrenal glands are unremarkable in
appearance. The kidneys are within normal limits. There is no
evidence of hydronephrosis. No renal or ureteral stones are
identified. No perinephric stranding is seen.

Stomach/Bowel: The stomach is unremarkable in appearance. The small
bowel is within normal limits. The appendix is normal in caliber,
without evidence of appendicitis. The colon is unremarkable in
appearance.

Vascular/Lymphatic: The abdominal aorta is unremarkable in
appearance. The inferior vena cava is grossly unremarkable. No
retroperitoneal lymphadenopathy is seen. No pelvic sidewall
lymphadenopathy is identified.

Reproductive: The bladder is mildly distended and grossly
unremarkable. The prostate remains normal in size.

Other: Small anterior abdominal wall hernias are noted, superior to
and at the umbilicus, without evidence of bowel herniation.

Musculoskeletal: No acute osseous abnormalities are identified. The
visualized musculature is unremarkable in appearance.
IMPRESSION: 1. No acute abnormality seen within the abdomen or pelvis.
2. Small anterior abdominal wall hernias, superior to and at the
umbilicus, without evidence of bowel herniation.

## 2020-03-18 ENCOUNTER — Encounter: Payer: Self-pay | Admitting: Internal Medicine

## 2020-03-18 ENCOUNTER — Encounter (INDEPENDENT_AMBULATORY_CARE_PROVIDER_SITE_OTHER): Payer: Self-pay

## 2020-03-18 ENCOUNTER — Ambulatory Visit (INDEPENDENT_AMBULATORY_CARE_PROVIDER_SITE_OTHER): Payer: BC Managed Care – PPO | Admitting: Internal Medicine

## 2020-03-18 ENCOUNTER — Other Ambulatory Visit: Payer: Self-pay

## 2020-03-18 VITALS — BP 136/84 | HR 96 | Temp 97.1°F | Resp 16 | Ht 68.75 in | Wt 242.0 lb

## 2020-03-18 DIAGNOSIS — E782 Mixed hyperlipidemia: Secondary | ICD-10-CM | POA: Diagnosis not present

## 2020-03-18 DIAGNOSIS — Z6836 Body mass index (BMI) 36.0-36.9, adult: Secondary | ICD-10-CM

## 2020-03-18 DIAGNOSIS — M25522 Pain in left elbow: Secondary | ICD-10-CM

## 2020-03-18 DIAGNOSIS — Z8679 Personal history of other diseases of the circulatory system: Secondary | ICD-10-CM

## 2020-03-18 DIAGNOSIS — R7303 Prediabetes: Secondary | ICD-10-CM | POA: Diagnosis not present

## 2020-03-18 NOTE — Progress Notes (Signed)
Patient ID: Jared Dixon, male    DOB: 1986-10-21, 34 y.o.   MRN: [id number]  PCP: Alba Cory, MD  Chief Complaint  Patient presents with  . Elbow Pain    left elbow; started 2-3 weeks ago, feels like it locks    Subjective:   Jared Dixon is a 34 y.o. male, presents to clinic with CC of the following:  Chief Complaint  Patient presents with  . Elbow Pain    left elbow; started 2-3 weeks ago, feels like it locks    HPI:  Patient is a 34 year old male patient of Dr. Carlynn Purl who presents with elbow pain.  He has a history of hypertension, obesity and last labs in April 2020 showed hyperlipidemia, and prediabetes.  His elbow pain started about 2 to 3 weeks ago, Feels pain on the medial aspect Pain increased with pulling his machine at work, as he often pulls it behind him and lifts it on and off of the truck as well.  He notes a pull and twist motion of the wrist that he does frequently with this.  He notes his fingers occasionally go numb when he is very active with this activity, and sometimes it is whole hand will tingle, and denies feeling it more on 1 side of the hand. Also notes it feels like it locks at times, although seems to be more hesitancy with movement than actually getting stuck in a position. No marked swelling No major one-time trauma prior He notes no increase in any lifting activities or other activities outside of those he does repetitively at work. not dropping things with no hand weakness. Has tried ibuprofen to help to date and he notes he does think it is helpful.  Takes 3 at a time. He is left-handed  No major history of elbow injury No history of repetitive throwing activities in his past. He had something like this in his left shoulder a while back, and that has improved, any states it is like it moved to his elbow.  Current activity level -does no regular exercise with no regular aerobic activity noted and does not lift weights.  Tob -  former smoker  He has gained some weight since his visit last April.  Denies any strict dietary limitations, and does drink sodas presently.  Patient Active Problem List   Diagnosis Date Noted  . Obesity (BMI 30.0-34.9) 04/02/2019  . History of hypertension 10/27/2013  . Migraine 10/27/2013      Current Outpatient Medications:  .  ibuprofen (ADVIL) 200 MG tablet, Take 200 mg by mouth every 6 (six) hours as needed., Disp: , Rfl:    Allergies  Allergen Reactions  . Lisinopril     Hair loss      No past surgical history on file.   Family History  Problem Relation Age of Onset  . Hypertension Father   . Cancer Maternal Grandmother        States it is in remission  . Liver disease Maternal Grandfather   . Alcohol abuse Maternal Grandfather   . Hypertension Paternal Grandmother      Social History   Tobacco Use  . Smoking status: Former Smoker    Packs/day: 0.50    Types: Cigarettes    Start date: 12/26/2003    Quit date: 04/01/2009    Years since quitting: 10.9  . Smokeless tobacco: Never Used  Substance Use Topics  . Alcohol use: Yes    Comment: occasionally  With staff assistance, above reviewed with the patient today.  ROS: As per HPI, otherwise no specific complaints on a limited and focused system review   No results found for this or any previous visit (from the past 72 hour(s)).   PHQ2/9: Depression screen [facility] 2/9 03/18/2020 04/02/2019  Decreased Interest 0 0  Down, Depressed, Hopeless 0 0  PHQ - 2 Score 0 0  Altered sleeping 1 0  Tired, decreased energy 0 0  Change in appetite 0 0  Feeling bad or failure about yourself  0 0  Trouble concentrating 0 0  Moving slowly or fidgety/restless 0 0  Suicidal thoughts 0 0  PHQ-9 Score 1 0  Difficult doing work/chores Not difficult at all Not difficult at all   PHQ-2/9 Result is neg  Fall Risk: Fall Risk  03/18/2020 04/02/2019  Falls in the past year? 0 0  Number falls in past yr: 0 0  Injury with  Fall? 0 0      Objective:   Vitals:   03/18/20 1107  BP: 136/84  Pulse: 96  Resp: 16  Temp: (!) 97.1 F (36.2 C)  TempSrc: Temporal  SpO2: 99%  Weight: 242 lb (109.8 kg)  Height: 5' 8.75" (1.746 m)    Body mass index is 36 kg/m.  Physical Exam   NAD, masked, pleasant, obese HEENT - [place]/AT, sclera anicteric, conj - non-inj'ed, pharynx clear Neck - supple,  LUE - Elbow - Good ROM, very hesitant with ROM testing, could get to full flexion and extension  No swelling or bruising  Tender at medial epicondyle  NT lateral epicondyle  NT olecranon (although he did note at times he can feel some discomfort over the olecranon area)  Good strength with biceps and triceps testing, not painful with testing with resistance.  Slight ncreased pain with wrist extension and flexion vs resistance  More pain with pronation and supination, and increased more first resistance, greater with supination than pronation  Not marked pain with hand grip, but notes can increase pain with his activities at work  Sensation intact to LT in distal UE and pulses good  Tinel's negative  Mild discomfort with valgus force testing medial, no discomfort testing lateral.  Neuro/psychiatric - affect was not flat, appropriate with conversation  Alert and oriented  Speech  normal   Results for orders placed or performed in visit on 04/02/19  Lipid panel  Result Value Ref Range   Cholesterol 198 <200 mg/dL   HDL 38 (L) > OR = 40 mg/dL   Triglycerides 417 (H) <150 mg/dL   LDL Cholesterol (Calc)  mg/dL (calc)   Total CHOL/HDL Ratio 5.2 (H) <5.0 (calc)   Non-HDL Cholesterol (Calc) 160 (H) <130 mg/dL (calc)  COMPLETE METABOLIC PANEL WITH GFR  Result Value Ref Range   Glucose, Bld 101 (H) 65 - 99 mg/dL   BUN 15 7 - 25 mg/dL   Creat 0.96 0.60 - 1.35 mg/dL   GFR, Est Non African American 103 > OR = 60 mL/min/1.80m2   GFR, Est African American 120 > OR = 60 mL/min/1.61m2   BUN/Creatinine Ratio NOT APPLICABLE 6 -  22 (calc)   Sodium 136 135 - 146 mmol/L   Potassium 4.2 3.5 - 5.3 mmol/L   Chloride 101 98 - 110 mmol/L   CO2 27 20 - 32 mmol/L   Calcium 10.0 8.6 - 10.3 mg/dL   Total Protein 8.1 6.1 - 8.1 g/dL   Albumin 4.7 3.6 - 5.1 g/dL  Globulin 3.4 1.9 - 3.7 g/dL (calc)   AG Ratio 1.4 1.0 - 2.5 (calc)   Total Bilirubin 0.4 0.2 - 1.2 mg/dL   Alkaline phosphatase (APISO) 58 36 - 130 U/L   AST 16 10 - 40 U/L   ALT 22 9 - 46 U/L  Hemoglobin A1c  Result Value Ref Range   Hgb A1c MFr Bld 6.1 (H) <5.7 % of total Hgb   Mean Plasma Glucose 128 (calc)   eAG (mmol/L) 7.1 (calc)  HIV Antibody (routine testing w rflx)  Result Value Ref Range   HIV 1&2 Ab, 4th Generation NON-REACTIVE NON-REACTI  RPR  Result Value Ref Range   RPR Ser Ql NON-REACTIVE NON-REACTI  CBC with Differential/Platelet  Result Value Ref Range   WBC 8.7 3.8 - 10.8 Thousand/uL   RBC 5.68 4.20 - 5.80 Million/uL   Hemoglobin 14.9 13.2 - 17.1 g/dL   HCT 56.4 33.2 - 95.1 %   MCV 80.1 80.0 - 100.0 fL   MCH 26.2 (L) 27.0 - 33.0 pg   MCHC 32.7 32.0 - 36.0 g/dL   RDW 88.4 16.6 - 06.3 %   Platelets 350 140 - 400 Thousand/uL   MPV 9.9 7.5 - 12.5 fL   Neutro Abs 4,924 1,500 - 7,800 cells/uL   Lymphs Abs 3,054 850 - 3,900 cells/uL   Absolute Monocytes 592 200 - 950 cells/uL   Eosinophils Absolute 104 15 - 500 cells/uL   Basophils Absolute 26 0 - 200 cells/uL   Neutrophils Relative % 56.6 %   Total Lymphocyte 35.1 %   Monocytes Relative 6.8 %   Eosinophils Relative 1.2 %   Basophils Relative 0.3 %   Noted these lab results from last April with him today.    Assessment & Plan:   1. Left elbow pain - Medial epicondylitis (golfer's elbow)  Educated on diagnosis  Relative rest until symptoms improving, with trying to use his right arm with activities at work, and he stated he could try to do so. Ice liberally recommended in the short term Can use an aleve product (or generic naproxen) up to two tabs twice daily for the next 1 to 2  weeks, then use prn with food after as noted chronic use of these medicines can increase blood pressure and affect the kidneys as well. Also recommended a tennis elbow strap, and explained well typically for lateral epicondylitis (tennis elbow), can be effective for medial epicondylitis by changing the insertion site and lessen stress on the medial epicondyle  2. History of hypertension Blood pressure okay on check today  3. Prediabetes   4. Mixed hyperlipidemia   5. Obesity   Discussed concerns with his weight slightly higher today from previous, and his history of prediabetes, hyperlipidemia, and the obesity from his most recent visit in April 2020.  Noted concerns with how weight gain can affect these conditions, and also the importance of dietary modifications and some aerobic exercise regularly to help.  Included some dietary information in his AVS to help. Agreed to follow-up in 4 weeks time, and also at that time can recheck his labs and further emphasized the importance of lifestyle modifications to help.  Also noted may need medicines to help in combination, and await that follow-up presently. Can follow-up sooner as needed        Jamelle Haring, MD 03/18/20 11:18 AM

## 2020-03-18 NOTE — Patient Instructions (Signed)
Take two Aleve tablets twice daily with food for the next couple weeks, and then use as needed after for discomfort.  Also can get a tennis elbow strap, and use when active at work or with other activities to help.  Also, as we discussed, try to use your right upper extremity predominantly with pulling and lifting activities at work in the short-term.   Golfer's Elbow  Golfer's elbow, also called medial epicondylitis, is a condition that results from inflammation of the strong bands of tissue (tendons) that attach your forearm muscles to the inside of your bone at the elbow. These tendons affect the muscles that bend the palm toward the wrist (flexion). The tendons become less flexible with age. This condition is called golfer's elbow because it is more common among people who constantly bend and twist their wrists, such as golfers. This injury is usually caused by overuse. What are the causes? This condition is caused by:  Repeatedly flexing, turning, or twisting your wrist.  Constantly gripping objects with your hands. What increases the risk? This condition is more likely to develop in people who play golf, baseball, or tennis. This injury is more common among people who have jobs that require the constant use of their hands, such as:  Carpenters.  Butchers.  Musicians.  Typists. What are the signs or symptoms? This condition causes elbow pain that may spread to your forearm and upper arm. Symptoms of this condition include.  Pain at the inner elbow, forearm, or wrist.  Reduced grip strength. The pain may get worse when you bend your wrist downward. How is this diagnosed? This condition is diagnosed based on your symptoms, medical history, and a physical exam. During the exam, your health care provider may:  Test your grip strength.  Move your wrist to check for pain. You may also have an MRI to:  Confirm the diagnosis.  Look for other issues.  Check for tears in the  ligaments, muscles, or tendons. How is this treated? Treatment for this condition includes:  Stopping all activities that make you bend or twist your elbow or wrist and waiting until your pain and other symptoms go away before resuming those activities.  Wearing an elbow brace or wrist splint to restrict the movements that cause symptoms.  Icing your inner elbow, forearm, or wrist to relieve pain.  Taking NSAIDs or getting corticosteroid injections to reduce pain and swelling.  Doing stretching, range-of-motion, and strengthening exercises (physical therapy) as told by your health care provider. In rare cases, surgery may be needed if your condition does not improve. Follow these instructions at home: If you have a brace or splint:  Wear it as told by your health care provider.  Loosen it if your fingers tingle, become numb, or turn cold and blue.  Keep it clean. Managing pain, stiffness, and swelling   If directed, put ice on the injured area. ? Put ice in a plastic bag. ? Place a towel between your skin and the bag. ? Leave the ice on for 20 minutes, 2-3 times a day.  Move your fingers often to avoid stiffness.  Raise (elevate) the injured area above the level of your heart while you are sitting or lying down. Activity  Rest your injured area as told by your health care provider.  Return to your normal activities as told by your health care provider. Ask your health care provider what activities are safe for you.  Do exercises as told by your health care  provider. Lifestyle  If your condition is caused by sports, work with a trainer to make sure that you: ? Have the correct technique. ? Are using the proper equipment.  If your condition is work related, talk with your employer about changes that can be made. General instructions  Take over-the-counter and prescription medicines only as told by your health care provider.  Do not use any products that contain  nicotine or tobacco, such as cigarettes, e-cigarettes, and chewing tobacco. If you need help quitting, ask your health care provider.  Keep all follow-up visits as told by your health care provider. This is important. How is this prevented?  Before and after activity: ? Warm up and stretch before being active. ? Cool down and stretch after being active. ? Give your body time to rest between periods of activity.  During activity: ? Make sure to use equipment that fits you. ? If you play golf, slow your golf swing to reduce shock in the arm when making contact with the ball.  Maintain physical fitness, including: ? Strength. ? Flexibility. ? Cardiovascular fitness. ? Endurance.  Do exercises to strengthen the forearm muscles. Contact a health care provider if:  Your pain does not improve or it gets worse.  You notice numbness in your hand. Get help right away if:  Your pain is severe.  You cannot move your wrist. Summary  Golfer's elbow, also called medial epicondylitis, is a condition that results from inflammation of the strong bands of tissue (tendons) that attach your forearm muscles to the inside of your bone at the elbow.  This injury usually results from overuse.  Symptoms of this condition include decreased grip strength and pain at the inner elbow, forearm, or wrist.  This injury is treated with rest, ice, medicines, physical therapy, and surgery as needed. This information is not intended to replace advice given to you by your health care provider. Make sure you discuss any questions you have with your health care provider. Document Revised: 04/03/2019 Document Reviewed: 10/17/2018 Elsevier Patient Education  2020 [employer].   Diabetes Mellitus and Nutrition, Adult When you have diabetes (diabetes mellitus), it is very important to have healthy eating habits because your blood sugar (glucose) levels are greatly affected by what you eat and drink. Eating  healthy foods in the appropriate amounts, at about the same times every day, can help you:  Control your blood glucose.  Lower your risk of heart disease.  Improve your blood pressure.  Reach or maintain a healthy weight. Every person with diabetes is different, and each person has different needs for a meal plan. Your health care provider may recommend that you work with a diet and nutrition specialist (dietitian) to make a meal plan that is best for you. Your meal plan may vary depending on factors such as:  The calories you need.  The medicines you take.  Your weight.  Your blood glucose, blood pressure, and cholesterol levels.  Your activity level.  Other health conditions you have, such as heart or kidney disease. How do carbohydrates affect me? Carbohydrates, also called carbs, affect your blood glucose level more than any other type of food. Eating carbs naturally raises the amount of glucose in your blood. Carb counting is a method for keeping track of how many carbs you eat. Counting carbs is important to keep your blood glucose at a healthy level, especially if you use insulin or take certain oral diabetes medicines. It is important to know how  many carbs you can safely have in each meal. This is different for every person. Your dietitian can help you calculate how many carbs you should have at each meal and for each snack. Foods that contain carbs include:  Bread, cereal, rice, pasta, and crackers.  Potatoes and corn.  Peas, beans, and lentils.  Milk and yogurt.  Fruit and juice.  Desserts, such as cakes, cookies, ice cream, and candy. How does alcohol affect me? Alcohol can cause a sudden decrease in blood glucose (hypoglycemia), especially if you use insulin or take certain oral diabetes medicines. Hypoglycemia can be a life-threatening condition. Symptoms of hypoglycemia (sleepiness, dizziness, and confusion) are similar to symptoms of having too much alcohol. If  your health care provider says that alcohol is safe for you, follow these guidelines:  Limit alcohol intake to no more than 1 drink per day for nonpregnant women and 2 drinks per day for men. One drink equals 12 oz of beer, 5 oz of wine, or 1 oz of hard liquor.  Do not drink on an empty stomach.  Keep yourself hydrated with water, diet soda, or unsweetened iced tea.  Keep in mind that regular soda, juice, and other mixers may contain a lot of sugar and must be counted as carbs. What are tips for following this plan?  Reading food labels  Start by checking the serving size on the "Nutrition Facts" label of packaged foods and drinks. The amount of calories, carbs, fats, and other nutrients listed on the label is based on one serving of the item. Many items contain more than one serving per package.  Check the total grams (g) of carbs in one serving. You can calculate the number of servings of carbs in one serving by dividing the total carbs by 15. For example, if a food has 30 g of total carbs, it would be equal to 2 servings of carbs.  Check the number of grams (g) of saturated and trans fats in one serving. Choose foods that have low or no amount of these fats.  Check the number of milligrams (mg) of salt (sodium) in one serving. Most people should limit total sodium intake to less than 2,300 mg per day.  Always check the nutrition information of foods labeled as "low-fat" or "nonfat". These foods may be higher in added sugar or refined carbs and should be avoided.  Talk to your dietitian to identify your daily goals for nutrients listed on the label. Shopping  Avoid buying canned, premade, or processed foods. These foods tend to be high in fat, sodium, and added sugar.  Shop around the outside edge of the grocery store. This includes fresh fruits and vegetables, bulk grains, fresh meats, and fresh dairy. Cooking  Use low-heat cooking methods, such as baking, instead of high-heat  cooking methods like deep frying.  Cook using healthy oils, such as olive, canola, or sunflower oil.  Avoid cooking with butter, cream, or high-fat meats. Meal planning  Eat meals and snacks regularly, preferably at the same times every day. Avoid going long periods of time without eating.  Eat foods high in fiber, such as fresh fruits, vegetables, beans, and whole grains. Talk to your dietitian about how many servings of carbs you can eat at each meal.  Eat 4-6 ounces (oz) of lean protein each day, such as lean meat, chicken, fish, eggs, or tofu. One oz of lean protein is equal to: ? 1 oz of meat, chicken, or fish. ? 1 egg. ?  cup of tofu.  Eat some foods each day that contain healthy fats, such as avocado, nuts, seeds, and fish. Lifestyle  Check your blood glucose regularly.  Exercise regularly as told by your health care provider. This may include: ? 150 minutes of moderate-intensity or vigorous-intensity exercise each week. This could be brisk walking, biking, or water aerobics. ? Stretching and doing strength exercises, such as yoga or weightlifting, at least 2 times a week.  Take medicines as told by your health care provider.  Do not use any products that contain nicotine or tobacco, such as cigarettes and e-cigarettes. If you need help quitting, ask your health care provider.  Work with a Veterinary surgeon or diabetes educator to identify strategies to manage stress and any emotional and social challenges. Questions to ask a health care provider  Do I need to meet with a diabetes educator?  Do I need to meet with a dietitian?  What number can I call if I have questions?  When are the best times to check my blood glucose? Where to find more information:  American Diabetes Association: diabetes.org  Academy of Nutrition and Dietetics: www.eatright.[employer] of Diabetes and Digestive and Kidney Diseases (NIH): CarFlippers.tn Summary  A healthy meal plan  will help you control your blood glucose and maintain a healthy lifestyle.  Working with a diet and nutrition specialist (dietitian) can help you make a meal plan that is best for you.  Keep in mind that carbohydrates (carbs) and alcohol have immediate effects on your blood glucose levels. It is important to count carbs and to use alcohol carefully. This information is not intended to replace advice given to you by your health care provider. Make sure you discuss any questions you have with your health care provider. Document Revised: 11/23/2017 Document Reviewed: 01/15/2017 Elsevier Patient Education  2020 [employer].

## 2020-04-06 ENCOUNTER — Ambulatory Visit: Payer: BLUE CROSS/BLUE SHIELD | Admitting: Family Medicine

## 2020-04-19 ENCOUNTER — Encounter: Payer: Self-pay | Admitting: Family Medicine

## 2020-04-19 ENCOUNTER — Other Ambulatory Visit: Payer: Self-pay

## 2020-04-19 ENCOUNTER — Ambulatory Visit (INDEPENDENT_AMBULATORY_CARE_PROVIDER_SITE_OTHER): Payer: BC Managed Care – PPO | Admitting: Family Medicine

## 2020-04-19 VITALS — BP 130/84 | HR 92 | Temp 97.3°F | Resp 18 | Ht 68.75 in | Wt 245.7 lb

## 2020-04-19 DIAGNOSIS — Z6836 Body mass index (BMI) 36.0-36.9, adult: Secondary | ICD-10-CM

## 2020-04-19 DIAGNOSIS — R7303 Prediabetes: Secondary | ICD-10-CM

## 2020-04-19 DIAGNOSIS — Z79899 Other long term (current) drug therapy: Secondary | ICD-10-CM

## 2020-04-19 DIAGNOSIS — E782 Mixed hyperlipidemia: Secondary | ICD-10-CM

## 2020-04-19 DIAGNOSIS — M25522 Pain in left elbow: Secondary | ICD-10-CM | POA: Diagnosis not present

## 2020-04-19 MED ORDER — MELOXICAM 15 MG PO TABS: 15.0000 mg | ORAL_TABLET | Freq: Every day | ORAL | 0 refills | Status: DC

## 2020-04-19 NOTE — Progress Notes (Signed)
Name: Jared Dixon   MRN: [id number]    DOB: 04-29-1986   Date:04/19/2020       Progress Note  Subjective  Chief Complaint  Chief Complaint  Patient presents with  . Joint Swelling    left elbow pain    HPI  Left elbow pain: he was seen by Dr. Dorris Fetch on 03/18/2020, he was diagnosed with golfer's elbow and advised nsaid's , rest , he states pain is not any better, and now has noticed that his forearm seems to be doing most of the work, biceps seems to be getting weaker. 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 No swelling, redness or increase in warmth. The pain can be intense when turning his arms in certain directions, also has difficulty extending elbow completely at times.   Pre-diabetes: he has increased his physical activity, riding his bike twice weekly for about 60-120  Minutes, he has been trying to cut down on carbohydrates  Mixed hyperlipidemia: we will recheck labs, he has been physically active   Patient Active Problem List   Diagnosis Date Noted  . Prediabetes 03/18/2020  . Mixed hyperlipidemia 03/18/2020  . Class 2 severe obesity due to excess calories with serious comorbidity and body mass index (BMI) of 36.0 to 36.9 in adult [facility]) 03/18/2020  . History of hypertension 10/27/2013  . Migraine 10/27/2013    No past surgical history on file.  Family History  Problem Relation Age of Onset  . Hypertension Father   . Cancer Maternal Grandmother        States it is in remission  . Liver disease Maternal Grandfather   . Alcohol abuse Maternal Grandfather   . Hypertension Paternal Grandmother     Social History   Tobacco Use  . Smoking status: Former Smoker    Packs/day: 0.50    Types: Cigarettes    Start date: 12/26/2003    Quit date: 04/01/2009    Years since quitting: 11.0  . Smokeless tobacco: Never Used  Substance Use Topics  . Alcohol use: Yes    Comment:  occasionally     Current Outpatient Medications:  .  ibuprofen (ADVIL) 200 MG tablet, Take 200 mg by mouth every 6 (six) hours as needed., Disp: , Rfl:   Allergies  Allergen Reactions  . Lisinopril     Hair loss     I personally reviewed active problem list, medication list, allergies, family history, social history, health maintenance with the patient/caregiver today.   ROS  Constitutional: Negative for fever , positive for  weight change.  Respiratory: Negative for cough and shortness of breath.   Cardiovascular: Negative for chest pain or palpitations.  Gastrointestinal: Negative for abdominal pain, no bowel changes.  Musculoskeletal: Negative for gait problem or joint swelling.  Skin: Negative for rash.  Neurological: Negative for dizziness or headache.  No other specific complaints in a complete review of systems (except as listed in HPI above).  Objective  Vitals:   04/19/20 0850  BP: 130/84  Pulse: 92  Resp: 18  Temp: (!) 97.3 F (36.3 C)  TempSrc: Temporal  SpO2: 100%  Weight: 245 lb 11.2 oz (111.4 kg)  Height: 5' 8.75" (1.746 m)    Body mass index is 36.55 kg/m.  Physical Exam  Constitutional: Patient appears well-developed and well-nourished. Obese/muscular   No distress.  HEENT: head atraumatic, normocephalic, pupils equal and reactive  to light Cardiovascular: Normal rate, regular rhythm and normal heart sounds.  No murmur heard. No BLE edema. Pulmonary/Chest: Effort normal and breath sounds normal. No respiratory distress. Abdominal: Soft.  There is no tenderness. Muscular Skeletal: pain during palpation of left medial epicondyle, pain with extension of elbow, also pain during pronation and supination  Psychiatric: Patient has a normal mood and affect. behavior is normal. Judgment and thought content normal.  PHQ2/9: Depression screen [facility] 2/9 04/19/2020 03/18/2020 04/02/2019  Decreased Interest 0 0 0  Down, Depressed, Hopeless 0 0 0  PHQ - 2 Score 0  0 0  Altered sleeping 0 1 0  Tired, decreased energy 0 0 0  Change in appetite 0 0 0  Feeling bad or failure about yourself  0 0 0  Trouble concentrating 0 0 0  Moving slowly or fidgety/restless 0 0 0  Suicidal thoughts 0 0 0  PHQ-9 Score 0 1 0  Difficult doing work/chores Not difficult at all Not difficult at all Not difficult at all    phq 9 is negative   Fall Risk: Fall Risk  04/19/2020 03/18/2020 04/02/2019  Falls in the past year? 0 0 0  Number falls in past yr: 0 0 0  Injury with Fall? 0 0 0  Follow up Falls evaluation completed - -     Functional Status Survey: Is the patient deaf or have difficulty hearing?: No Does the patient have difficulty seeing, even when wearing glasses/contacts?: No Does the patient have difficulty concentrating, remembering, or making decisions?: No Does the patient have difficulty walking or climbing stairs?: No Does the patient have difficulty dressing or bathing?: No Does the patient have difficulty doing errands alone such as visiting a doctor's office or shopping?: No    Assessment & Plan  1. Left elbow pain  - Ambulatory referral to Orthopedic Surgery Ice the area, rest  - meloxicam (MOBIC) 15 MG tablet; Take 1 tablet (15 mg total) by mouth daily.  Dispense: 90 tablet; Refill: 0 Excuse for work today and tomorrow   2. Prediabetes  - Hemoglobin A1c  3. Mixed hyperlipidemia  - Lipid panel - CBC with Differential/Platelet - COMPLETE METABOLIC PANEL WITH GFR  4. Class 2 severe obesity due to excess calories with serious comorbidity and body mass index (BMI) of 36.0 to 36.9 in adult [facility])  He is muscular, but also likes eating out for lunch, discussed healthier options  5. Long-term use of high-risk medication  - COMPLETE METABOLIC PANEL WITH GFR

## 2020-04-20 LAB — LIPID PANEL
Cholesterol: 178 mg/dL (ref <200)
HDL: 38 mg/dL — ABNORMAL LOW (ref >=40)
LDL Cholesterol (Calc): 106 mg/dL (calc) — ABNORMAL HIGH
Non-HDL Cholesterol (Calc): 140 mg/dL (calc) — ABNORMAL HIGH (ref <130)
Total CHOL/HDL Ratio: 4.7 (calc) (ref <5.0)
Triglycerides: 226 mg/dL — ABNORMAL HIGH (ref <150)

## 2020-04-20 LAB — COMPLETE METABOLIC PANEL WITH GFR
AG Ratio: 1.4 (calc) (ref 1.0–2.5)
ALT: 21 U/L (ref 9–46)
AST: 17 U/L (ref 10–40)
Albumin: 4.6 g/dL (ref 3.6–5.1)
Alkaline phosphatase (APISO): 55 U/L (ref 36–130)
BUN: 14 mg/dL (ref 7–25)
CO2: 30 mmol/L (ref 20–32)
Calcium: 10.3 mg/dL (ref 8.6–10.3)
Chloride: 101 mmol/L (ref 98–110)
Creat: 0.98 mg/dL (ref 0.60–1.35)
GFR, Est African American: 116 mL/min/{1.73_m2} (ref >=60)
GFR, Est Non African American: 100 mL/min/{1.73_m2} (ref >=60)
Globulin: 3.4 g/dL (calc) (ref 1.9–3.7)
Glucose, Bld: 101 mg/dL — ABNORMAL HIGH (ref 65–99)
Potassium: 4.8 mmol/L (ref 3.5–5.3)
Sodium: 139 mmol/L (ref 135–146)
Total Bilirubin: 0.3 mg/dL (ref 0.2–1.2)
Total Protein: 8 g/dL (ref 6.1–8.1)

## 2020-04-20 LAB — CBC WITH DIFFERENTIAL/PLATELET
Absolute Monocytes: 520 cells/uL (ref 200–950)
Basophils Absolute: 24 cells/uL (ref 0–200)
Basophils Relative: 0.3 %
Eosinophils Absolute: 112 cells/uL (ref 15–500)
Eosinophils Relative: 1.4 %
HCT: 45.3 % (ref 38.5–50.0)
Hemoglobin: 14.9 g/dL (ref 13.2–17.1)
Lymphs Abs: 2528 cells/uL (ref 850–3900)
MCH: 26.5 pg — ABNORMAL LOW (ref 27.0–33.0)
MCHC: 32.9 g/dL (ref 32.0–36.0)
MCV: 80.5 fL (ref 80.0–100.0)
MPV: 9.5 fL (ref 7.5–12.5)
Monocytes Relative: 6.5 %
Neutro Abs: 4816 cells/uL (ref 1500–7800)
Neutrophils Relative %: 60.2 %
Platelets: 340 10*3/uL (ref 140–400)
RBC: 5.63 10*6/uL (ref 4.20–5.80)
RDW: 13.6 % (ref 11.0–15.0)
Total Lymphocyte: 31.6 %
WBC: 8 10*3/uL (ref 3.8–10.8)

## 2020-04-20 LAB — HEMOGLOBIN A1C
Hgb A1c MFr Bld: 6.2 % of total Hgb — ABNORMAL HIGH (ref <5.7)
Mean Plasma Glucose: 131 (calc)
eAG (mmol/L): 7.3 (calc)

## 2020-04-28 ENCOUNTER — Encounter: Payer: BC Managed Care – PPO | Admitting: Family Medicine

## 2020-07-13 ENCOUNTER — Other Ambulatory Visit: Payer: Self-pay | Admitting: Family Medicine

## 2020-07-13 DIAGNOSIS — M25522 Pain in left elbow: Secondary | ICD-10-CM

## 2020-07-13 NOTE — Telephone Encounter (Signed)
Requested Prescriptions  Pending Prescriptions Disp Refills  . meloxicam (MOBIC) 15 MG tablet [Pharmacy Med Name: MELOXICAM 15 MG TABLET] 90 tablet 0    Sig: TAKE 1 TABLET BY MOUTH EVERY DAY     Analgesics:  COX2 Inhibitors Passed - 07/13/2020  2:02 AM      Passed - HGB in normal range and within 360 days    Hemoglobin  Date Value Ref Range Status  04/19/2020 14.9 13.2 - 17.1 g/dL Final         Passed - Cr in normal range and within 360 days    Creat  Date Value Ref Range Status  04/19/2020 0.98 0.60 - 1.35 mg/dL Final         Passed - Patient is not pregnant      Passed - Valid encounter within last 12 months    Recent Outpatient Visits          2 months ago Mixed hyperlipidemia   [facility] Alba Cory, MD   3 months ago Left elbow pain   [facility] Jamelle Haring, MD   1 year ago Shoulder impingement, left   [facility] Alba Cory, MD
